# Patient Record
Sex: Female | Born: 1985 | Race: White | Hispanic: No | Marital: Single | Smoking: Former smoker
Health system: Southern US, Community
[De-identification: ages and names within clinical notes are randomized; demographics above are authoritative.]

## PROBLEM LIST (undated history)

## (undated) DIAGNOSIS — R87629 Unspecified abnormal cytological findings in specimens from vagina: Secondary | ICD-10-CM

## (undated) DIAGNOSIS — F1911 Other psychoactive substance abuse, in remission: Secondary | ICD-10-CM

## (undated) DIAGNOSIS — F32A Depression, unspecified: Secondary | ICD-10-CM

## (undated) DIAGNOSIS — F419 Anxiety disorder, unspecified: Secondary | ICD-10-CM

## (undated) HISTORY — DX: Anxiety disorder, unspecified: F41.9

## (undated) HISTORY — DX: Depression, unspecified: F32.A

---

## 2012-07-26 ENCOUNTER — Emergency Department: Payer: Self-pay | Admitting: Emergency Medicine

## 2012-07-26 LAB — CBC
Platelet: 289 10*3/uL (ref 150–440)
RBC: 4.31 10*6/uL (ref 3.80–5.20)
WBC: 14.7 10*3/uL — ABNORMAL HIGH (ref 3.6–11.0)

## 2012-07-26 LAB — URINALYSIS, COMPLETE
Bacteria: NONE SEEN
Specific Gravity: 1.034 (ref 1.003–1.030)
Squamous Epithelial: NONE SEEN

## 2012-07-26 LAB — HCG, QUANTITATIVE, PREGNANCY: Beta Hcg, Quant.: 7245 m[IU]/mL — ABNORMAL HIGH

## 2012-07-30 LAB — PATHOLOGY REPORT

## 2014-07-06 LAB — OB RESULTS CONSOLE HGB/HCT, BLOOD: Hemoglobin: 14.8 g/dL

## 2014-07-06 LAB — OB RESULTS CONSOLE HIV ANTIBODY (ROUTINE TESTING): HIV: NONREACTIVE

## 2014-07-07 ENCOUNTER — Ambulatory Visit: Payer: Self-pay | Admitting: Family Medicine

## 2014-07-07 LAB — OB RESULTS CONSOLE VARICELLA ZOSTER ANTIBODY, IGG: Varicella: IMMUNE

## 2014-07-07 LAB — OB RESULTS CONSOLE ABO/RH: RH Type: POSITIVE

## 2014-07-07 LAB — OB RESULTS CONSOLE HEPATITIS B SURFACE ANTIGEN: HEP B S AG: NEGATIVE

## 2014-07-07 LAB — OB RESULTS CONSOLE HGB/HCT, BLOOD: HCT: 42 %

## 2014-07-07 LAB — OB RESULTS CONSOLE TSH: TSH: 0.17

## 2014-07-07 LAB — OB RESULTS CONSOLE RPR: RPR: NONREACTIVE

## 2014-07-07 LAB — OB RESULTS CONSOLE PLATELET COUNT: Platelets: 272 10*3/uL

## 2014-07-07 LAB — OB RESULTS CONSOLE RUBELLA ANTIBODY, IGM: Rubella: NON-IMMUNE/NOT IMMUNE

## 2014-07-08 LAB — OB RESULTS CONSOLE GC/CHLAMYDIA
CHLAMYDIA, DNA PROBE: NEGATIVE
GC PROBE AMP, GENITAL: NEGATIVE

## 2014-07-30 ENCOUNTER — Encounter: Payer: Self-pay | Admitting: Maternal & Fetal Medicine

## 2014-08-21 NOTE — L&D Delivery Note (Signed)
VAGINAL DELIVERY NOTE:  Date of Delivery: 01/23/2015 Primary OB: [facility]  Gestational Age/EDD: 5953w1d 02/05/2015, by Last Menstrual Period Antepartum complications: none Attending Physician: Annamarie MajorPaul Hilda Rynders, MD, FACOG Delivery Type: spontaneous vaginal delivery  Anesthesia: none Laceration: periurethral Episiotomy: none Placenta: spontaneous Intrapartum complications: None Estimated Blood Loss: 250 GBS: neg Procedure Details: ROM at time of crowning.  Left periurethral repaired.  Baby: Doreatha MartinLiveborn female, Apgars 5/7

## 2014-09-10 ENCOUNTER — Encounter: Payer: Self-pay | Admitting: Obstetrics & Gynecology

## 2014-10-01 ENCOUNTER — Encounter: Payer: Self-pay | Admitting: Maternal & Fetal Medicine

## 2014-11-19 ENCOUNTER — Encounter
Admit: 2014-11-19 | Disposition: A | Discharge status: Home / Self Care | Payer: Self-pay | Attending: Obstetrics and Gynecology | Admitting: Obstetrics and Gynecology

## 2014-11-20 ENCOUNTER — Encounter
Admit: 2014-11-20 | Disposition: A | Discharge status: Home / Self Care | Payer: Self-pay | Attending: Obstetrics and Gynecology | Admitting: Obstetrics and Gynecology

## 2014-12-09 ENCOUNTER — Ambulatory Visit
Admit: 2014-12-09 | Disposition: A | Discharge status: Home / Self Care | Payer: Self-pay | Attending: Obstetrics and Gynecology | Admitting: Obstetrics and Gynecology

## 2015-01-07 ENCOUNTER — Other Ambulatory Visit: Payer: Self-pay | Admitting: Family Medicine

## 2015-01-08 LAB — OB RESULTS CONSOLE GBS: GBS: NEGATIVE

## 2015-01-23 ENCOUNTER — Inpatient Hospital Stay
Admission: EM | Admit: 2015-01-23 | Discharge: 2015-01-25 | DRG: 775 | Disposition: A | Discharge status: Home / Self Care | Payer: Medicaid Other | Attending: Obstetrics & Gynecology | Admitting: Obstetrics & Gynecology

## 2015-01-23 DIAGNOSIS — O09893 Supervision of other high risk pregnancies, third trimester: Secondary | ICD-10-CM | POA: Diagnosis present

## 2015-01-23 DIAGNOSIS — Z3A38 38 weeks gestation of pregnancy: Secondary | ICD-10-CM | POA: Diagnosis present

## 2015-01-23 DIAGNOSIS — O99333 Smoking (tobacco) complicating pregnancy, third trimester: Secondary | ICD-10-CM | POA: Diagnosis present

## 2015-01-23 DIAGNOSIS — Z3490 Encounter for supervision of normal pregnancy, unspecified, unspecified trimester: Secondary | ICD-10-CM

## 2015-01-23 DIAGNOSIS — N879 Dysplasia of cervix uteri, unspecified: Secondary | ICD-10-CM | POA: Diagnosis present

## 2015-01-23 DIAGNOSIS — F1120 Opioid dependence, uncomplicated: Secondary | ICD-10-CM | POA: Diagnosis present

## 2015-01-23 DIAGNOSIS — O99324 Drug use complicating childbirth: Secondary | ICD-10-CM | POA: Diagnosis present

## 2015-01-23 HISTORY — DX: Other psychoactive substance abuse, in remission: F19.11

## 2015-01-23 HISTORY — DX: Unspecified abnormal cytological findings in specimens from vagina: R87.629

## 2015-01-23 LAB — URINE DRUG SCREEN, QUALITATIVE (ARMC ONLY)
Amphetamines, Ur Screen: NOT DETECTED
BENZODIAZEPINE, UR SCRN: NOT DETECTED
Barbiturates, Ur Screen: NOT DETECTED
CANNABINOID 50 NG, UR ~~LOC~~: NOT DETECTED
Cocaine Metabolite,Ur ~~LOC~~: NOT DETECTED
MDMA (Ecstasy)Ur Screen: NOT DETECTED
METHADONE SCREEN, URINE: NOT DETECTED
Opiate, Ur Screen: NOT DETECTED
Phencyclidine (PCP) Ur S: NOT DETECTED
Tricyclic, Ur Screen: NOT DETECTED

## 2015-01-23 LAB — CBC
HCT: 38.4 % (ref 35.0–47.0)
Hemoglobin: 13.4 g/dL (ref 12.0–16.0)
MCH: 31.2 pg (ref 26.0–34.0)
MCHC: 34.9 g/dL (ref 32.0–36.0)
MCV: 89.4 fL (ref 80.0–100.0)
PLATELETS: 214 10*3/uL (ref 150–440)
RBC: 4.3 MIL/uL (ref 3.80–5.20)
RDW: 12.9 % (ref 11.5–14.5)
WBC: 18.9 10*3/uL — ABNORMAL HIGH (ref 3.6–11.0)

## 2015-01-23 LAB — ABO/RH: ABO/RH(D): A POS

## 2015-01-23 LAB — TYPE AND SCREEN
ABO/RH(D): A POS
Antibody Screen: NEGATIVE

## 2015-01-23 MED ORDER — SENNOSIDES-DOCUSATE SODIUM 8.6-50 MG PO TABS
2.0000 | ORAL_TABLET | ORAL | Status: DC
  Administered 2015-01-24 (×2): 2 via ORAL
  Filled 2015-01-23 (×3): qty 2

## 2015-01-23 MED ORDER — BUPRENORPHINE HCL 8 MG SL SUBL
12.0000 mg | SUBLINGUAL_TABLET | Freq: Every day | SUBLINGUAL | Status: DC
  Administered 2015-01-23 – 2015-01-25 (×3): 12 mg via SUBLINGUAL
  Filled 2015-01-23 (×2): qty 2
  Filled 2015-01-23: qty 6

## 2015-01-23 MED ORDER — BUTORPHANOL TARTRATE 1 MG/ML IJ SOLN
1.0000 mg | INTRAMUSCULAR | Status: DC | PRN
  Administered 2015-01-23: 2 mg via INTRAVENOUS

## 2015-01-23 MED ORDER — SIMETHICONE 80 MG PO CHEW
80.0000 mg | CHEWABLE_TABLET | ORAL | Status: DC | PRN
  Filled 2015-01-23: qty 1

## 2015-01-23 MED ORDER — BENZOCAINE-MENTHOL 20-0.5 % EX AERO
1.0000 "application " | INHALATION_SPRAY | CUTANEOUS | Status: DC | PRN
  Administered 2015-01-23: 1 via TOPICAL
  Filled 2015-01-23: qty 56

## 2015-01-23 MED ORDER — SODIUM CHLORIDE 0.9 % IV SOLN: 250.0000 mL | INTRAVENOUS | Status: DC | PRN

## 2015-01-23 MED ORDER — LACTATED RINGERS IV SOLN
INTRAVENOUS | Status: DC
  Administered 2015-01-23: 125 mL/h via INTRAVENOUS

## 2015-01-23 MED ORDER — IBUPROFEN 600 MG PO TABS
ORAL_TABLET | ORAL | Status: AC
  Administered 2015-01-23: 600 mg via ORAL
  Filled 2015-01-23: qty 1

## 2015-01-23 MED ORDER — ONDANSETRON HCL 4 MG/2ML IJ SOLN: 4.0000 mg | INTRAMUSCULAR | Status: DC | PRN

## 2015-01-23 MED ORDER — ACETAMINOPHEN 325 MG PO TABS: 650.0000 mg | ORAL_TABLET | ORAL | Status: DC | PRN

## 2015-01-23 MED ORDER — OXYTOCIN 40 UNITS IN LACTATED RINGERS INFUSION - SIMPLE MED
62.5000 mL/h | INTRAVENOUS | Status: DC | PRN
  Administered 2015-01-23: 62.5 mL/h via INTRAVENOUS

## 2015-01-23 MED ORDER — LIDOCAINE HCL (PF) 1 % IJ SOLN
INTRAMUSCULAR | Status: AC
  Administered 2015-01-23: 06:00:00
  Filled 2015-01-23: qty 30

## 2015-01-23 MED ORDER — PRENATAL MULTIVITAMIN CH
1.0000 | ORAL_TABLET | Freq: Every day | ORAL | Status: DC
  Administered 2015-01-23 – 2015-01-25 (×3): 1 via ORAL
  Filled 2015-01-23 (×3): qty 1

## 2015-01-23 MED ORDER — SODIUM CHLORIDE 0.9 % IJ SOLN: 3.0000 mL | Freq: Two times a day (BID) | INTRAMUSCULAR | Status: DC

## 2015-01-23 MED ORDER — ONDANSETRON HCL 4 MG PO TABS
4.0000 mg | ORAL_TABLET | ORAL | Status: DC | PRN
  Filled 2015-01-23: qty 1

## 2015-01-23 MED ORDER — SODIUM CHLORIDE 0.9 % IJ SOLN: 3.0000 mL | INTRAMUSCULAR | Status: DC | PRN

## 2015-01-23 MED ORDER — IBUPROFEN 600 MG PO TABS
600.0000 mg | ORAL_TABLET | Freq: Four times a day (QID) | ORAL | Status: DC
  Administered 2015-01-23 – 2015-01-25 (×10): 600 mg via ORAL
  Filled 2015-01-23 (×9): qty 1

## 2015-01-23 MED ORDER — OXYTOCIN 40 UNITS IN LACTATED RINGERS INFUSION - SIMPLE MED
INTRAVENOUS | Status: AC
  Filled 2015-01-23: qty 1000

## 2015-01-23 MED ORDER — WITCH HAZEL-GLYCERIN EX PADS: 1.0000 "application " | MEDICATED_PAD | CUTANEOUS | Status: DC | PRN

## 2015-01-23 MED ORDER — FERROUS SULFATE 325 (65 FE) MG PO TABS
325.0000 mg | ORAL_TABLET | Freq: Two times a day (BID) | ORAL | Status: DC
  Administered 2015-01-23 – 2015-01-25 (×5): 325 mg via ORAL
  Filled 2015-01-23 (×5): qty 1

## 2015-01-23 MED ORDER — DIBUCAINE 1 % RE OINT: 1.0000 "application " | TOPICAL_OINTMENT | RECTAL | Status: DC | PRN

## 2015-01-23 MED ORDER — CALCIUM CARBONATE ANTACID 500 MG PO CHEW: 2.0000 | CHEWABLE_TABLET | ORAL | Status: DC | PRN

## 2015-01-23 MED ORDER — DIPHENHYDRAMINE HCL 25 MG PO CAPS: 25.0000 mg | ORAL_CAPSULE | Freq: Four times a day (QID) | ORAL | Status: DC | PRN

## 2015-01-23 MED ORDER — MEASLES, MUMPS & RUBELLA VAC ~~LOC~~ INJ
0.5000 mL | INJECTION | Freq: Once | SUBCUTANEOUS | Status: AC
Start: 2015-01-24 — End: 2015-01-25
  Administered 2015-01-25: 0.5 mL via SUBCUTANEOUS
  Filled 2015-01-23: qty 0.5

## 2015-01-23 MED ORDER — LANOLIN HYDROUS EX OINT: TOPICAL_OINTMENT | CUTANEOUS | Status: DC | PRN

## 2015-01-23 MED ORDER — ZOLPIDEM TARTRATE 5 MG PO TABS: 5.0000 mg | ORAL_TABLET | Freq: Every evening | ORAL | Status: DC | PRN

## 2015-01-23 NOTE — H&P (Signed)
Obstetrics Admission History & Physical   No chief complaint on file.   HPI:  29 y.o. G2P0010 @ 1657w1d (02/05/2015, by Last Menstrual Period). Admitted on 01/23/2015:   Ctxs tonight.  No VB or ROM.  PNC at [facility] and also [facility], particularly for Cervical Dysplasia. 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  Biopsies.Rubella NI.  Patient Active Problem List   Diagnosis Date Noted  . Pregnancy 01/23/2015    Presents for LABOR.  [facility] at [facility]  PMHx:  Past Medical History  Diagnosis Date  . Vaginal Pap smear, abnormal    PSHx: No past surgical history on file. Medications:  No prescriptions prior to admission   Allergies: has No Known Allergies. OBHx:  OB History  Gravida Para Term Preterm AB SAB TAB Ectopic Multiple Living  2 0 0 0 1 1 0 0 0 0     # Outcome Date GA Lbr Len/2nd Weight Sex Delivery Anes PTL Lv  2 Current           1 SAB              ZOX:[id number]:Negative/unremarkable except as detailed in HPI. Soc Hx: Current smoker and Recreational drug use: former  Objective:   Filed Vitals:   01/23/15 0349  BP: 117/70  Pulse: 82  Temp: 98.1 F (36.7 C)  Resp: 20   General: Well nourished, well developed female in no acute distress.  Skin: Warm and dry.  Cardiovascular:Regular rate and rhythm. Respiratory: Clear to auscultation bilateral. Normal respiratory effort Abdomen: moderate Neuro/Psych: Normal mood and affect.   Pelvic exam: is not limited by body habitus EGBUS: within normal limits Vagina: within normal limits and with normal mucosa blood in the vault Cervix: 8/90/0 on admission Uterus: Spontaneous uterine activity  Adnexa: normal adnexa  EFM:FHR: 140 bpm, variability: moderate,  accelerations:  Present,  decelerations:  Absent Toco: Frequency: Every 2 minutes   Perinatal info:   A POS/A POS/ Rubella- Not immune/Varicella -Immune/ TDaP last tetanus booster within 10 years / RPR nr / HIV neg/ HBsAg neg    Assessment &  Plan:   29 y.o. G2P0010 @ 6957w1d, Admitted on 01/23/2015: LABOR, anticipate delivery     1. FWB: FHT category 1.  2. Pain management: Analgesia and epidural discussed. 3. ID: GBSnegative. Treat with ABX if positive. 4. Labor management: Exp Mgt

## 2015-01-24 LAB — CBC
HEMATOCRIT: 35.4 % (ref 35.0–47.0)
Hemoglobin: 11.7 g/dL — ABNORMAL LOW (ref 12.0–16.0)
MCH: 29.9 pg (ref 26.0–34.0)
MCHC: 33 g/dL (ref 32.0–36.0)
MCV: 90.6 fL (ref 80.0–100.0)
PLATELETS: 221 10*3/uL (ref 150–440)
RBC: 3.9 MIL/uL (ref 3.80–5.20)
RDW: 13 % (ref 11.5–14.5)
WBC: 14.7 10*3/uL — ABNORMAL HIGH (ref 3.6–11.0)

## 2015-01-24 NOTE — Progress Notes (Addendum)
Post Partum Day 1 Subjective:  Doing well, ambulating, tolerating regular PO diet, voiding spontaneously, tolerating pain with PO meds.  NO CP, SOB, Fever/chills, nausea/vomiting, or calf pain.   Objective: Blood pressure 127/80, pulse 62, temperature 98.3 F (36.8 C), temperature source Oral, resp. rate 18, SpO2 99 %, breastfeeding.  Physical Exam:  General: alert, cooperative and no distress  CV: RRR Pulm: CTABL nl resp effort Lochia: appropriate Uterine Fundus: firm DVT Evaluation: No evidence of DVT seen on physical exam.   Recent Labs  01/23/15 0324 01/24/15 0610  HGB 13.4 11.7*  HCT 38.4 35.4  WBC 18.9* 14.7*  PLT 214 221    Assessment/Plan: Plan for discharge tomorrow  Baby doing toxicology testing, unsure of disposition/discharge status Continue subutex   LOS: 1 day   Ward, Chelsea C 01/24/2015, 9:03 AM

## 2015-01-25 MED ORDER — IBUPROFEN 600 MG PO TABS: 600.0000 mg | ORAL_TABLET | Freq: Four times a day (QID) | ORAL | Status: DC

## 2015-01-25 MED ORDER — OXYTOCIN BOLUS FROM INFUSION: 500.0000 mL | INTRAVENOUS | Status: DC

## 2015-01-25 MED ORDER — PRENATAL MULTIVITAMIN CH: 1.0000 | ORAL_TABLET | Freq: Every day | ORAL | Status: DC

## 2015-01-25 MED ORDER — OXYTOCIN 40 UNITS IN LACTATED RINGERS INFUSION - SIMPLE MED: 62.5000 mL/h | INTRAVENOUS | Status: DC

## 2015-01-25 NOTE — Discharge Summary (Signed)
  Obstetrical Discharge Summary  Date of Admission: 01/23/2015 Date of Discharge: 01/25/15 Primary OB:  [facility]   Gestational Age at Delivery: 3163w1d  Antepartum complications: none Date of Delivery: 01/23/15  Delivered By: Tiburcio PeaHARRIS Delivery Type: spontaneous vaginal delivery Intrapartum complications/course: None Anesthesia: none Placenta: spontaneous Laceration: periurethral Episiotomy: none Baby: Liveborn female, Apgars 5/7, weight 5# 4oz  Post partum course: Since the delivery, patient has tolerate activity, diet, and daily functions without difficulty or complication.  Min lochia.  No breast concerns at this time.  No signs of depression currently.   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     prenatal multivitamin Tabs tablet  Take 1 tablet by mouth daily at 12 noon.     HEMOGLOBIN  Date Value Ref Range Status  01/24/2015 11.7* 12.0 - 16.0 g/dL Final  09/81/191411/16/2015 78.214.8 g/dL Final   HGB  Date Value Ref Range Status  07/26/2012 13.2 12.0-16.0 g/dL Final   HCT  Date Value Ref Range Status  01/24/2015 35.4 35.0 - 47.0 % Final  07/07/2014 42 % Final  07/26/2012 37.3 35.0-47.0 % Final    Physical Exam:  General: alert Lochia: appropriate Uterine Fundus: firm Incision: N/A DVT Evaluation: No evidence of DVT seen  on physical exam. Abdomen: abdomen is soft without significant tenderness, masses, organomegaly or guarding   Call office if you have any of the following: headache, visual changes, fever >100 F, chills, breast concerns, excessive vaginal bleeding, incision drainage or problems, leg pain or redness, depression or any other concerns.   Activity: Do not lift > 10 lbs for 6 weeks.  No intercourse or tampons for 6 weeks.  No driving for 1-2 weeks.   Discharge to: home Follow-up Information    Follow up with [facility]. Call in 6 weeks.   Why:  for postpartum check and nexplanon placement   Contact information:   [address] [place] [phone]        Marta AntuBrothers, Dondi Burandt, [place] 01/25/2015, 12:05 PM

## 2015-01-25 NOTE — Discharge Instructions (Signed)
Discharge instructions:   Call office if you have any of the following: headache, visual changes, fever >100 F, chills, breast concerns, excessive vaginal bleeding(saturating more than one pad an hour or large palm sized clots) , leg pain or redness, depression(more than just baby blues wanting to hurt yourself or the baby ) or any other concerns.   Activity: Do not lift > 10 lbs for 6 weeks.  No intercourse or tampons for 6 weeks.  No driving for 1-2 weeks.

## 2015-01-25 NOTE — Progress Notes (Signed)
All discharge instructions given to patient and she voices understanding of all instructions given.all prescriptions given.  She will make her own 6 wks f/u appt. MMR given. Pt discharged but baby will remain a patient in pediatrics with mom staying with baby.

## 2015-01-26 LAB — RPR: RPR Ser Ql: NONREACTIVE

## 2015-01-26 LAB — SURGICAL PATHOLOGY

## 2015-01-27 ENCOUNTER — Ambulatory Visit: Payer: Self-pay

## 2015-01-27 NOTE — Lactation Note (Signed)
This note was copied from the chart of Wanda Philippa Sicksshley Samek. Mom is sefLactation Consultation Note  Patient Name: Wanda Dunlap ZOXWR'UToday's Date: 01/27/2015  Mom is severely engorged today, so that latch was very difficult for baby. Mom said she has been this way for two days. I taught her how to hand express to soften areola. She immediately got 10 ml in a few minutes. We then latched baby on, and he started to swallow vigorously and often. Breast softened a little bit , but Mom still needs to express out enough milk afterwards to soften breast. Double electric pump is in room too, but she did real well with hand expression.  Since she is going home today and needs pump for home use, [facility]. Mom is to secure a ride over there before 3pm today. I also gave her some cold cabbage leaves to apply to engorged breasts after feeding/pumping. She was given a bag of snappies and labels.    Maternal Data Has patient been taught Hand Expression?: Yes  Feeding Feeding Type: Breast Fed Length of feed: 5 min  LATCH Score/Interventions Latch: Repeated attempts needed to sustain latch, nipple held in mouth throughout feeding, stimulation needed to elicit sucking reflex. Intervention(s): Assist with latch;Breast massage;Breast compression  Audible Swallowing: Spontaneous and intermittent  Type of Nipple: Everted at rest and after stimulation  Comfort (Breast/Nipple): Engorged, cracked, bleeding, large blisters, severe discomfort Problem noted: Engorgment Intervention(s): Hand expression;Cabbage leaves     Hold (Positioning): No assistance needed to correctly position infant at breast. Intervention(s): Support Pillows  LATCH Score: 7  Lactation Tools Discussed/Used     Consult Status      Sunday CornSandra Clark Shell Bl[place]te 01/27/2015, 2:02 PM

## 2015-03-25 ENCOUNTER — Inpatient Hospital Stay
Admission: RE | Admit: 2015-03-25 | Discharge: 2015-03-25 | Disposition: A | Discharge status: Home / Self Care | Payer: Medicaid Other | Source: Ambulatory Visit

## 2015-03-25 DIAGNOSIS — Z79899 Other long term (current) drug therapy: Secondary | ICD-10-CM | POA: Diagnosis not present

## 2015-03-25 DIAGNOSIS — N870 Mild cervical dysplasia: Secondary | ICD-10-CM | POA: Diagnosis not present

## 2015-03-25 DIAGNOSIS — F17210 Nicotine dependence, cigarettes, uncomplicated: Secondary | ICD-10-CM | POA: Diagnosis not present

## 2015-03-25 DIAGNOSIS — N879 Dysplasia of cervix uteri, unspecified: Secondary | ICD-10-CM | POA: Diagnosis present

## 2015-03-25 NOTE — Patient Instructions (Signed)
  Your procedure is scheduled on: 03/30/15 Tues Report to Day Surgery. To find out your arrival time please call [phone] between 1PM - 3PM on 03/29/15 Mon.  Remember: Instructions that are not followed completely may result in serious medical risk, up to and including death, or upon the discretion of your surgeon and anesthesiologist your surgery may need to be rescheduled.    __x__ 1. Do not eat food or drink liquids after midnight. No gum chewing or hard candies.     __x__ 2. No Alcohol for 24 hours before or after surgery.   ____ 3. Bring all medications with you on the day of surgery if instructed.    ___x_ 4. Notify your doctor if there is any change in your medical condition     (cold, fever, infections).     Do not wear jewelry, make-up, hairpins, clips or nail polish.  Do not wear lotions, powders, or perfumes. You may wear deodorant.  Do not shave 48 hours prior to surgery. Men may shave face and neck.  Do not bring valuables to the hospital.    [facility] is not responsible for any belongings or valuables.               Contacts, dentures or bridgework may not be worn into surgery.  Leave your suitcase in the car. After surgery it may be brought to your room.  For patients admitted to the hospital, discharge time is determined by your                treatment team.   Patients discharged the day of surgery will not be allowed to drive home.   Please read over the following fact sheets that you were given:      ____ Take these medicines the morning of surgery with A SIP OF WATER:    1.   2.   3.   4.  5.  6.  ____ Fleet Enema (as directed)   ____ Use CHG Soap as directed  ____ Use inhalers on the day of surgery  ____ Stop metformin 2 days prior to surgery    ____ Take 1/2 of usual insulin dose the night before surgery and none on the morning of surgery.   ____ Stop Coumadin/Plavix/aspirin on   __x__ Stop Anti-inflammatories on   ____ Stop supplements  until after surgery.    ____ Bring C-Pap to the hospital.

## 2015-03-30 ENCOUNTER — Encounter: Payer: Self-pay | Admitting: Anesthesiology

## 2015-03-30 ENCOUNTER — Ambulatory Visit: Payer: Medicaid Other | Admitting: Anesthesiology

## 2015-03-30 ENCOUNTER — Encounter
Admission: RE | Disposition: A | Discharge status: Home / Self Care | Payer: Self-pay | Source: Ambulatory Visit | Attending: Obstetrics & Gynecology

## 2015-03-30 ENCOUNTER — Ambulatory Visit
Admission: RE | Admit: 2015-03-30 | Discharge: 2015-03-30 | Disposition: A | Discharge status: Home / Self Care | Payer: Medicaid Other | Source: Ambulatory Visit | Attending: Obstetrics & Gynecology | Admitting: Obstetrics & Gynecology

## 2015-03-30 DIAGNOSIS — F17210 Nicotine dependence, cigarettes, uncomplicated: Secondary | ICD-10-CM | POA: Insufficient documentation

## 2015-03-30 DIAGNOSIS — N870 Mild cervical dysplasia: Secondary | ICD-10-CM | POA: Diagnosis not present

## 2015-03-30 DIAGNOSIS — Z79899 Other long term (current) drug therapy: Secondary | ICD-10-CM | POA: Insufficient documentation

## 2015-03-30 HISTORY — PX: LEEP: SHX91

## 2015-03-30 LAB — POCT PREGNANCY, URINE: Preg Test, Ur: NEGATIVE

## 2015-03-30 SURGERY — LEEP (LOOP ELECTROSURGICAL EXCISION PROCEDURE)
Anesthesia: General | Wound class: Clean Contaminated

## 2015-03-30 MED ORDER — FERRIC SUBSULFATE 259 MG/GM EX SOLN
CUTANEOUS | Status: AC
Start: 2015-03-30 — End: 2015-03-30
  Filled 2015-03-30: qty 8

## 2015-03-30 MED ORDER — LIDOCAINE-EPINEPHRINE 1 %-1:100000 IJ SOLN
INTRAMUSCULAR | Status: DC | PRN
  Administered 2015-03-30: 30 mL

## 2015-03-30 MED ORDER — LIDOCAINE-EPINEPHRINE 1 %-1:100000 IJ SOLN
INTRAMUSCULAR | Status: AC
  Filled 2015-03-30: qty 1

## 2015-03-30 MED ORDER — FAMOTIDINE 20 MG PO TABS
ORAL_TABLET | ORAL | Status: AC
  Administered 2015-03-30: 20 mg via ORAL
  Filled 2015-03-30: qty 1

## 2015-03-30 MED ORDER — FENTANYL CITRATE (PF) 100 MCG/2ML IJ SOLN: 25.0000 ug | INTRAMUSCULAR | Status: DC | PRN

## 2015-03-30 MED ORDER — ONDANSETRON HCL 4 MG/2ML IJ SOLN
INTRAMUSCULAR | Status: DC | PRN
  Administered 2015-03-30: 4 mg via INTRAVENOUS

## 2015-03-30 MED ORDER — FENTANYL CITRATE (PF) 100 MCG/2ML IJ SOLN
INTRAMUSCULAR | Status: DC | PRN
  Administered 2015-03-30 (×2): 50 ug via INTRAVENOUS

## 2015-03-30 MED ORDER — PROPOFOL 10 MG/ML IV BOLUS
INTRAVENOUS | Status: DC | PRN
  Administered 2015-03-30: 120 mg via INTRAVENOUS

## 2015-03-30 MED ORDER — LIDOCAINE HCL (CARDIAC) 20 MG/ML IV SOLN
INTRAVENOUS | Status: DC | PRN
  Administered 2015-03-30: 100 mg via INTRAVENOUS

## 2015-03-30 MED ORDER — GLYCOPYRROLATE 0.2 MG/ML IJ SOLN
INTRAMUSCULAR | Status: DC | PRN
  Administered 2015-03-30: 0.2 mg via INTRAVENOUS

## 2015-03-30 MED ORDER — ONDANSETRON HCL 4 MG/2ML IJ SOLN: 4.0000 mg | Freq: Once | INTRAMUSCULAR | Status: DC | PRN

## 2015-03-30 MED ORDER — MIDAZOLAM HCL 2 MG/2ML IJ SOLN
INTRAMUSCULAR | Status: DC | PRN
  Administered 2015-03-30: 2 mg via INTRAVENOUS

## 2015-03-30 MED ORDER — IODINE STRONG (LUGOLS) 5 % PO SOLN
ORAL | Status: DC | PRN
  Administered 2015-03-30: 0.1 mL

## 2015-03-30 MED ORDER — LACTATED RINGERS IV SOLN
INTRAVENOUS | Status: DC
  Administered 2015-03-30: 15:00:00 via INTRAVENOUS

## 2015-03-30 MED ORDER — OXYCODONE-ACETAMINOPHEN 5-325 MG PO TABS: 1.0000 | ORAL_TABLET | ORAL | Status: DC | PRN

## 2015-03-30 MED ORDER — IODINE STRONG (LUGOLS) 5 % PO SOLN
ORAL | Status: AC
Start: 2015-03-30 — End: 2015-03-30
  Filled 2015-03-30: qty 1

## 2015-03-30 MED ORDER — KETOROLAC TROMETHAMINE 30 MG/ML IJ SOLN
INTRAMUSCULAR | Status: DC | PRN
  Administered 2015-03-30: 30 mg via INTRAVENOUS

## 2015-03-30 MED ORDER — FAMOTIDINE 20 MG PO TABS
20.0000 mg | ORAL_TABLET | Freq: Once | ORAL | Status: AC
  Administered 2015-03-30: 20 mg via ORAL

## 2015-03-30 MED ORDER — KETAMINE HCL 50 MG/ML IJ SOLN
INTRAMUSCULAR | Status: DC | PRN
  Administered 2015-03-30: 20 mg via INTRAMUSCULAR

## 2015-03-30 SURGICAL SUPPLY — 31 items
APPLICATOR COTTON TIP 6IN STRL (MISCELLANEOUS) ×3 IMPLANT
BAG COUNTER SPONGE EZ (MISCELLANEOUS) ×2 IMPLANT
CANISTER SUCT 1200ML W/VALVE (MISCELLANEOUS) ×3 IMPLANT
CATH ROBINSON RED A/P 16FR (CATHETERS) ×3 IMPLANT
CNTNR SPEC 2.5X3XGRAD LEK (MISCELLANEOUS)
CONT SPEC 4OZ STER OR WHT (MISCELLANEOUS)
CONTAINER SPEC 2.5X3XGRAD LEK (MISCELLANEOUS) IMPLANT
COUNTER SPONGE BAG EZ (MISCELLANEOUS) ×1
ELECT LEEP BALL 5MM 12CM (MISCELLANEOUS) ×3
ELECT LEEP LOOP 20X10 R2010 (MISCELLANEOUS) ×3
ELECT LOOP 1.0X1.0CM R1010 (MISCELLANEOUS) ×3
ELECTRODE LEEP BALL 5MM 12CM (MISCELLANEOUS) ×1 IMPLANT
ELECTRODE LEP LOOP 20X10 R2010 (MISCELLANEOUS) ×1 IMPLANT
ELECTRODE LOOP 1.0X1.0CM R1010 (MISCELLANEOUS) ×1 IMPLANT
GLOVE BIO SURGEON STRL SZ8 (GLOVE) ×9 IMPLANT
GOWN STRL REUS W/ TWL LRG LVL3 (GOWN DISPOSABLE) ×2 IMPLANT
GOWN STRL REUS W/ TWL XL LVL3 (GOWN DISPOSABLE) ×1 IMPLANT
GOWN STRL REUS W/TWL LRG LVL3 (GOWN DISPOSABLE) ×4
GOWN STRL REUS W/TWL XL LVL3 (GOWN DISPOSABLE) ×2
NEEDLE SPNL 22GX3.5 QUINCKE BK (NEEDLE) ×3 IMPLANT
NS IRRIG 500ML POUR BTL (IV SOLUTION) ×3 IMPLANT
PACK DNC HYST (MISCELLANEOUS) ×3 IMPLANT
PAD GROUND ADULT SPLIT (MISCELLANEOUS) ×3 IMPLANT
PAD OB MATERNITY 4.3X12.25 (PERSONAL CARE ITEMS) ×3 IMPLANT
PAD PREP 24X41 OB/GYN DISP (PERSONAL CARE ITEMS) ×3 IMPLANT
PENCIL ELECTRO HAND CTR (MISCELLANEOUS) ×3 IMPLANT
STRAP SAFETY BODY (MISCELLANEOUS) ×3 IMPLANT
STRAW SMOKE EVAC LEEP 6150 NON (MISCELLANEOUS) ×3 IMPLANT
SYR CONTROL 10ML (SYRINGE) ×3 IMPLANT
TUBING CONNECTING 10 (TUBING) ×2 IMPLANT
TUBING CONNECTING 10' (TUBING) ×1

## 2015-03-30 NOTE — Anesthesia Preprocedure Evaluation (Addendum)
Anesthesia Evaluation  Patient identified by MRN, date of birth, ID band Patient awake    Reviewed: Allergy & Precautions, NPO status , Patient's Chart, lab work & pertinent test results  Airway Mallampati: II  TM Distance: >3 FB Neck ROM: Full    Dental  (+) Chipped, Caps Temporary cap L upper molar:   Pulmonary Current Smoker,  breath sounds clear to auscultation  Pulmonary exam normal       Cardiovascular negative cardio ROS Normal cardiovascular exam    Neuro/Psych negative neurological ROS  negative psych ROS   GI/Hepatic negative GI ROS, Neg liver ROS,   Endo/Other  negative endocrine ROS  Renal/GU negative Renal ROS   Abnormal pap smear negative genitourinary   Musculoskeletal negative musculoskeletal ROS (+)   Abdominal Normal abdominal exam  (+)   Peds negative pediatric ROS (+)  Hematology negative hematology ROS (+)   Anesthesia Other Findings   Reproductive/Obstetrics negative OB ROS                            Anesthesia Physical Anesthesia Plan  ASA: II  Anesthesia Plan: General   Post-op Pain Management:    Induction: Intravenous  Airway Management Planned: LMA  Additional Equipment:   Intra-op Plan:   Post-operative Plan: Extubation in OR  Informed Consent: I have reviewed the patients History and Physical, chart, labs and discussed the procedure including the risks, benefits and alternatives for the proposed anesthesia with the patient or authorized representative who has indicated his/her understanding and acceptance.   Dental advisory given  Plan Discussed with: CRNA and Surgeon  Anesthesia Plan Comments:         Anesthesia Quick Evaluation

## 2015-03-30 NOTE — Discharge Instructions (Signed)
Loop Electrosurgical Excision Procedure Care After Refer to this sheet in the next few weeks. These instructions provide you with information on caring for yourself after your procedure. Your caregiver may also give you more specific instructions. Your treatment has been planned according to current medical practices, but problems sometimes occur. Call your caregiver if you have any problems or questions after your procedure. HOME CARE INSTRUCTIONS   Do not use tampons, douche, or have sexual intercourse for 2 weeks or as directed by your caregiver.  Begin normal activities if you have no or minimal cramping or bleeding, unless directed otherwise by your caregiver.  Take your temperature if you feel sick. Write down your temperature on paper, and tell your caregiver if you have a fever.  Take all medicines as directed by your caregiver.  Keep all your follow-up appointments and Pap tests as directed by your caregiver. SEEK IMMEDIATE MEDICAL CARE IF:   You have bleeding that is heavier or longer than a normal menstrual cycle.  You have bleeding that is bright red.  You have blood clots.  You have a fever.  You have increasing cramps or pain not relieved by medicine.  You develop abdominal pain that does not seem to be related to the same area of earlier cramping and pain.  You are lightheaded, unusually weak, or faint.  You develop painful or bloody urination.  You develop a bad smelling vaginal discharge. MAKE SURE YOU:  Understand these instructions.  Will watch your condition.  Will get help right away if you are not doing well or get worse. Document Released: 04/20/2011 Document Revised: 10/30/2011 Document Reviewed: 04/20/2011 [facility] Patient Information 2015 [place], [place]. This information is not intended to replace advice given to you by your health care provider. Make sure you discuss any questions you have with your health care provider. AMBULATORY SURGERY    DISCHARGE INSTRUCTIONS   1) The drugs that you were given will stay in your system until tomorrow so for the next 24 hours you should not:  A) Drive an automobile B) Make any legal decisions C) Drink any alcoholic beverage   2) You may resume regular meals tomorrow.  Today it is better to start with liquids and gradually work up to solid foods.  You may eat anything you prefer, but it is better to start with liquids, then soup and crackers, and gradually work up to solid foods.   3) Please notify your doctor immediately if you have any unusual bleeding, trouble breathing, redness and pain at the surgery site, drainage, fever, or pain not relieved by medication.    4) Additional Instructions:        Please contact your physician with any problems or Same Day Surgery at [phone], Monday through Friday 6 am to 4 pm, or [place] at [facility] number at [phone].

## 2015-03-30 NOTE — H&P (Signed)
History and Physical Interval Note:  03/30/2015 1:52 PM  Wanda Dunlap  has presented today for surgery, with the diagnosis of CERVICAL DYSPLASIA  The various methods of treatment have been discussed with the patient and family. After consideration of risks, benefits and other options for treatment, the patient has consented to  Procedure(s): LOOP ELECTROSURGICAL EXCISION PROCEDURE (LEEP) (N/A) as a surgical intervention .  The patient's history has been reviewed, patient examined, no change in status, stable for surgery.  Pt has the following beta blocker history-  Not taking Beta Blocker.  I have reviewed the patient's chart and labs.  Questions were answered to the patient's satisfaction.       Decklyn Hornik Renae Fickle

## 2015-03-30 NOTE — Op Note (Signed)
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 Cervix grossly showed normal findings.    PROCEDURE IN DETAIL: After informed consent was obtained, the patient was taken to the operating room where general anesthesia was administered without difficulty. The patient was positioned in the dorsal lithotomy position in [place] stirrups. Time-out was performed. The patient was examined under anesthesia, and the above findings were noted. She was prepped and draped in normal sterile fashion. The patient's bladder was cathaterized with an in and out catheter.  A weighted speculum and deaver were placed inside the patient's vagina. The above mentioned findings were noted.  Using the LEEP device and loops for ecto- and endo- cervical specimens,  the conization was performed, making sure to encompass the entire lesion and transformation zone and to take a deeper second specimen. The cone bed was then cauterized to achieve hemostasis. Speculum was removed and patient awoken from anesthesia.  The patient tolerated the procedure well. Sponge, lap, needle, and instrument counts were correct x 2. The patient was transferred to the recovery room awake, alert and breathing independently in stable condition.

## 2015-03-30 NOTE — Transfer of Care (Signed)
Immediate Anesthesia Transfer of Care Note  Patient: Wanda Dunlap  Procedure(s) Performed: Procedure(s): LOOP ELECTROSURGICAL EXCISION PROCEDURE (LEEP) (N/A)  Patient Location: PACU  Anesthesia Type:General  Level of Consciousness: sedated  Airway & Oxygen Therapy: Patient Spontanous Breathing and Patient connected to face mask oxygen  Post-op Assessment: Report given to RN  Post vital signs: Reviewed  Last Vitals:  Filed Vitals:   03/30/15 1520  BP: 95/46  Pulse:   Temp: 36.6 C  Resp: 12    Complications: No apparent anesthesia complications

## 2015-03-30 NOTE — Anesthesia Postprocedure Evaluation (Signed)
  Anesthesia Post-op Note  Patient: Wanda Dunlap  Procedure(s) Performed: Procedure(s): LOOP ELECTROSURGICAL EXCISION PROCEDURE (LEEP) (N/A)  Anesthesia type:General  Patient location: PACU  Post pain: Pain level controlled  Post assessment: Post-op Vital signs reviewed, Patient's Cardiovascular Status Stable, Respiratory Function Stable, Patent Airway and No signs of Nausea or vomiting  Post vital signs: Reviewed and stable  Last Vitals:  Filed Vitals:   03/30/15 1655  BP: 137/78  Pulse: 72  Temp:   Resp: 16    Level of consciousness: awake, alert  and patient cooperative  Complications: No apparent anesthesia complications

## 2015-03-30 NOTE — Anesthesia Procedure Notes (Signed)
Procedure Name: LMA Insertion Date/Time: 03/30/2015 2:47 PM Performed by: Shirlee Limerick, Rael Tilly Pre-anesthesia Checklist: Patient identified, Emergency Drugs available, Suction available and Patient being monitored Patient Re-evaluated:Patient Re-evaluated prior to inductionOxygen Delivery Method: Circle system utilized Preoxygenation: Pre-oxygenation with 100% oxygen Intubation Type: IV induction LMA Size: 4.0

## 2015-03-31 ENCOUNTER — Encounter: Payer: Self-pay | Admitting: Obstetrics & Gynecology

## 2015-04-01 LAB — SURGICAL PATHOLOGY

## 2018-02-18 DIAGNOSIS — Z5181 Encounter for therapeutic drug level monitoring: Secondary | ICD-10-CM | POA: Diagnosis not present

## 2018-02-20 DIAGNOSIS — Z5181 Encounter for therapeutic drug level monitoring: Secondary | ICD-10-CM | POA: Diagnosis not present

## 2018-02-25 DIAGNOSIS — Z5181 Encounter for therapeutic drug level monitoring: Secondary | ICD-10-CM | POA: Diagnosis not present

## 2018-02-27 DIAGNOSIS — Z5181 Encounter for therapeutic drug level monitoring: Secondary | ICD-10-CM | POA: Diagnosis not present

## 2018-03-05 DIAGNOSIS — Z5181 Encounter for therapeutic drug level monitoring: Secondary | ICD-10-CM | POA: Diagnosis not present

## 2018-03-07 DIAGNOSIS — Z5181 Encounter for therapeutic drug level monitoring: Secondary | ICD-10-CM | POA: Diagnosis not present

## 2018-03-12 DIAGNOSIS — Z5181 Encounter for therapeutic drug level monitoring: Secondary | ICD-10-CM | POA: Diagnosis not present

## 2018-03-14 DIAGNOSIS — M7989 Other specified soft tissue disorders: Secondary | ICD-10-CM | POA: Diagnosis not present

## 2018-03-14 DIAGNOSIS — Z91030 Bee allergy status: Secondary | ICD-10-CM | POA: Diagnosis not present

## 2018-03-14 DIAGNOSIS — Z5181 Encounter for therapeutic drug level monitoring: Secondary | ICD-10-CM | POA: Diagnosis not present

## 2018-03-20 DIAGNOSIS — Z5181 Encounter for therapeutic drug level monitoring: Secondary | ICD-10-CM | POA: Diagnosis not present

## 2018-03-25 DIAGNOSIS — Z5181 Encounter for therapeutic drug level monitoring: Secondary | ICD-10-CM | POA: Diagnosis not present

## 2018-04-02 DIAGNOSIS — Z5181 Encounter for therapeutic drug level monitoring: Secondary | ICD-10-CM | POA: Diagnosis not present

## 2018-04-08 DIAGNOSIS — Z5181 Encounter for therapeutic drug level monitoring: Secondary | ICD-10-CM | POA: Diagnosis not present

## 2018-04-15 DIAGNOSIS — Z5181 Encounter for therapeutic drug level monitoring: Secondary | ICD-10-CM | POA: Diagnosis not present

## 2018-04-23 DIAGNOSIS — Z5181 Encounter for therapeutic drug level monitoring: Secondary | ICD-10-CM | POA: Diagnosis not present

## 2018-04-29 DIAGNOSIS — Z5181 Encounter for therapeutic drug level monitoring: Secondary | ICD-10-CM | POA: Diagnosis not present

## 2018-05-06 DIAGNOSIS — Z5181 Encounter for therapeutic drug level monitoring: Secondary | ICD-10-CM | POA: Diagnosis not present

## 2018-05-13 DIAGNOSIS — Z5181 Encounter for therapeutic drug level monitoring: Secondary | ICD-10-CM | POA: Diagnosis not present

## 2018-05-20 DIAGNOSIS — Z5181 Encounter for therapeutic drug level monitoring: Secondary | ICD-10-CM | POA: Diagnosis not present

## 2018-05-27 DIAGNOSIS — Z5181 Encounter for therapeutic drug level monitoring: Secondary | ICD-10-CM | POA: Diagnosis not present

## 2018-06-03 DIAGNOSIS — Z5181 Encounter for therapeutic drug level monitoring: Secondary | ICD-10-CM | POA: Diagnosis not present

## 2018-06-10 DIAGNOSIS — Z5181 Encounter for therapeutic drug level monitoring: Secondary | ICD-10-CM | POA: Diagnosis not present

## 2018-07-15 DIAGNOSIS — Z30017 Encounter for initial prescription of implantable subdermal contraceptive: Secondary | ICD-10-CM | POA: Diagnosis not present

## 2018-07-15 DIAGNOSIS — Z3009 Encounter for other general counseling and advice on contraception: Secondary | ICD-10-CM | POA: Diagnosis not present

## 2018-07-15 DIAGNOSIS — Z309 Encounter for contraceptive management, unspecified: Secondary | ICD-10-CM | POA: Diagnosis not present

## 2019-03-05 DIAGNOSIS — H5213 Myopia, bilateral: Secondary | ICD-10-CM | POA: Diagnosis not present

## 2019-03-06 DIAGNOSIS — H5213 Myopia, bilateral: Secondary | ICD-10-CM | POA: Diagnosis not present

## 2019-05-07 DIAGNOSIS — H52223 Regular astigmatism, bilateral: Secondary | ICD-10-CM | POA: Diagnosis not present

## 2020-01-16 DIAGNOSIS — Z23 Encounter for immunization: Secondary | ICD-10-CM | POA: Diagnosis not present

## 2020-02-13 DIAGNOSIS — Z23 Encounter for immunization: Secondary | ICD-10-CM | POA: Diagnosis not present

## 2021-04-30 DIAGNOSIS — Z20822 Contact with and (suspected) exposure to covid-19: Secondary | ICD-10-CM | POA: Diagnosis not present

## 2021-05-05 DIAGNOSIS — Z20822 Contact with and (suspected) exposure to covid-19: Secondary | ICD-10-CM | POA: Diagnosis not present

## 2021-06-02 ENCOUNTER — Other Ambulatory Visit: Payer: Self-pay

## 2021-06-02 ENCOUNTER — Ambulatory Visit: Payer: Medicaid Other

## 2021-06-02 ENCOUNTER — Encounter: Payer: Self-pay | Admitting: Family Medicine

## 2021-06-02 ENCOUNTER — Ambulatory Visit (LOCAL_COMMUNITY_HEALTH_CENTER): Payer: Medicaid Other | Admitting: Family Medicine

## 2021-06-02 VITALS — BP 120/72 | HR 76 | Temp 98.4°F | Resp 16 | Ht 60.0 in | Wt 106.0 lb

## 2021-06-02 DIAGNOSIS — Z8742 Personal history of other diseases of the female genital tract: Secondary | ICD-10-CM

## 2021-06-02 DIAGNOSIS — Z01419 Encounter for gynecological examination (general) (routine) without abnormal findings: Secondary | ICD-10-CM

## 2021-06-02 DIAGNOSIS — Z0389 Encounter for observation for other suspected diseases and conditions ruled out: Secondary | ICD-10-CM | POA: Diagnosis not present

## 2021-06-02 DIAGNOSIS — Z1272 Encounter for screening for malignant neoplasm of vagina: Secondary | ICD-10-CM | POA: Diagnosis not present

## 2021-06-02 DIAGNOSIS — Z113 Encounter for screening for infections with a predominantly sexual mode of transmission: Secondary | ICD-10-CM

## 2021-06-02 DIAGNOSIS — Z3009 Encounter for other general counseling and advice on contraception: Secondary | ICD-10-CM

## 2021-06-06 NOTE — Progress Notes (Signed)
[facility] DEPARTMENT [facility] [address] Main Number: [phone]  Family Planning Visit- Initial Visit  Subjective:  Wanda Dunlap is a 35 y.o.  G2P1011   being seen today for an initial annual visit and to discuss contraceptive options.  The patient is currently using Nexplanon for pregnancy prevention. Patient reports she does not want a pregnancy in the next year.  Patient has the following medical conditions has Pregnancy; Postpartum care following vaginal delivery; and History of abnormal cervical Pap smear on their problem list.  Chief Complaint  Patient presents with   Annual Exam    Patient reports here for physical, pap, STI screening and nexplanon removal.    Patient denies sex since 03/21/2020    Body mass index is 20.7 kg/m. - Patient is eligible for diabetes screening based on BMI and age >43?  no HA1C ordered? no  Patient reports 0  partner/s in last year. Desires STI screening?  Yes  Has patient been screened once for HCV in the past?  No  No results found for: HCVAB  Does the patient have current drug use (including MJ), have a partner with drug use, and/or has been incarcerated since last result? No  If yes-- Screen for HCV through [facility]   Does the patient meet criteria for HBV testing? Yes  Criteria:  -Household, sexual or needle sharing contact with HBV -History of drug use -HIV positive -Those with known Hep C   Health Maintenance Due  Topic Date Due   COVID-19 Vaccine (1) Never done   Hepatitis C Screening  Never done   TETANUS/TDAP  Never done   PAP SMEAR-Modifier  Never done   INFLUENZA VACCINE  Never done    Review of Systems  Gastrointestinal:  Positive for nausea.  Neurological:  Positive for headaches.   The following portions of the patient's history were reviewed and updated as appropriate: allergies, current medications, past family history, past medical history, past social  history, past surgical history and problem list. Problem list updated.   See flowsheet for other program required questions.  Objective:   Vitals:   06/02/21 1040  BP: 120/72  Pulse: 76  Resp: 16  Temp: 98.4 F (36.9 C)  TempSrc: Temporal  Weight: 106 lb (48.1 kg)  Height: 5' (1.524 m)    Physical Exam Vitals and nursing note reviewed.  Constitutional:      Appearance: Normal appearance.  HENT:     Head: Normocephalic and atraumatic.     Mouth/Throat:     Mouth: Mucous membranes are moist.     Dentition: Normal dentition. No dental caries.     Pharynx: No oropharyngeal exudate or posterior oropharyngeal erythema.  Eyes:     General: No scleral icterus. Neck:     Thyroid: No thyroid mass, thyromegaly or thyroid tenderness.  Cardiovascular:     Rate and Rhythm: Normal rate and regular rhythm.     Pulses: Normal pulses.     Heart sounds: Normal heart sounds.  Pulmonary:     Effort: Pulmonary effort is normal.     Breath sounds: Normal breath sounds.  Chest:  Breasts:    Right: Normal.     Left: Normal.     Comments: Breasts:        Right: Normal. No swelling, mass, nipple discharge, skin change or tenderness.        Left: Normal. No swelling, mass, nipple discharge, skin change or tenderness.   Abdominal:  General: Abdomen is flat. Bowel sounds are normal.     Palpations: Abdomen is soft.  Genitourinary:    Comments: External genitalia without, lice, nits, erythema, edema , lesions or inguinal adenopathy. Vagina with normal mucosa and discharge and pH equals 4.  Cervix without visual lesions, uterus firm, mobile, non-tender, no masses, CMT adnexal fullness or tenderness.   Musculoskeletal:        General: Normal range of motion.     Cervical back: Normal range of motion and neck supple.  Skin:    General: Skin is warm and dry.  Neurological:     General: No focal deficit present.     Mental Status: She is alert.      Assessment and Plan:  Wanda Dunlap is a 35 y.o. female presenting [facility] the [facility] Department for an initial annual wellness/contraceptive visit   Patient was not offered ECP based on pt request.   1. Smear, vaginal, as part of routine gynecological examination Family planning physical CBE today PAP today  Pt reports Hx of daily HA with nausea that stated 1 month ago post covid infection.  Patient wakes up with HA.  Does not take any medications to help with HA.  Recommended patient to take tylenol before be to see if helps and also notify PCP for further follow up and management.    - IGP, Aptima HPV  2. Screening examination for venereal disease Pt last sex was 03/21/2020, desires screening today.   - HIV [facility] - Syphilis Serology, [facility] - Chlamydia/Gonorrhea [facility] - WET PREP FOR TRICH, YEAST, CLUE  3. 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 Family planning education, guidance, and counseling Contraception counseling: Reviewed all forms of birth control options in the tiered based approach. available including abstinence; over the counter/barrier methods; hormonal contraceptive medication including pill, patch, ring, injection,contraceptive implant, ECP; hormonal and nonhormonal IUDs; permanent sterilization options including vasectomy and the various tubal sterilization modalities. Risks, benefits, and typical effectiveness rates were reviewed.  Questions were answered.  Written information was also given to the patient to review.  Patient desires to keep nexplanon for 1 more year . She will follow up in 1 year for removal and possible reinsertion.   She was told to call with any further questions, or with any concerns about this method of contraception.  Emphasized use of condoms 100% of the time for STI prevention.   Return in about 1 year (around 06/02/2022) for annual well woman exam.  No future  appointments.  Wendi Snipes, FNP

## 2021-06-08 LAB — IGP, APTIMA HPV
HPV Aptima: POSITIVE — AB
PAP Smear Comment: 0

## 2021-06-09 ENCOUNTER — Telehealth: Payer: Self-pay

## 2021-06-09 NOTE — Telephone Encounter (Signed)
Contacted patient regarding her MyChart, Patient's question was to get an appointment with [facility] in 1 month (which is the soonest they can see her) or go to Urgent Care.    Patient states she has no pain or issues with the hernia. I suggested to patient to get that appointment with them as she as no pain but if she does develop pain to then see Urgent Care before the month.    Will notify provider, Elveria Rising, FNP to see can send the referral to her PCP.   Floy Sabina, RN

## 2021-06-13 ENCOUNTER — Other Ambulatory Visit: Payer: Self-pay | Admitting: Family Medicine

## 2021-06-23 ENCOUNTER — Telehealth: Payer: Self-pay

## 2021-06-23 ENCOUNTER — Telehealth: Payer: Self-pay | Admitting: Obstetrics and Gynecology

## 2021-06-23 NOTE — Telephone Encounter (Signed)
Wanda Dunlap with the health department called stating that the pt had a abnormal PAP and is needing to have a Colpo done.  Pt has an appointment scheduled for 06/24/2021 (Friday).

## 2021-06-23 NOTE — Telephone Encounter (Signed)
Telephone call to [facility] this morning.  Per Epic: patient had scheduled an appointment for 06/24/2021 to establish care on her own.  On the appointment notes line it read "positive HPV".   I called [facility] to make them aware I had reached out to the patient via TC, MyChart and PAP card to discuss her PAP results and the need for a Colpo referral and that the patient had already reviewed her results via MyChart on 06/23/2021 per Epic.  The receptionist at [facility] was going to make a note for the provider to see prior to tomorrow's appointment. Hart Carwin, RN

## 2021-06-23 NOTE — Telephone Encounter (Signed)
Telephone call to patient this morning and voicemail not setup.  MyChart message sent to patient regarding her 06/02/2021 PAP results.  PAP Normal and HPV Positive.  Colpo referral needed per Wendi Snipes, NP.  If patient does not follow through, we will repeat PAP in 1 year (05-2022). PAP card also mailed to the patient home address today.  HPV brochure mailed as well. Hart Carwin, RN

## 2021-06-23 NOTE — Telephone Encounter (Signed)
Return call by patient today.  Patient agrees to have Dr. Logan Bores do the Colpo.  She will discuss with him at tomorrow's appointment 06/24/2021 9 am and she understands it may not be tomorrow, but will schedule the Colpo before she leaves the office. [facility] already aware of the 06/02/2021 abnormal PAP and the need for a Colpo per earlier phone call today. Hart Carwin, RN

## 2021-06-24 ENCOUNTER — Encounter: Payer: Medicaid Other | Admitting: Obstetrics and Gynecology

## 2021-06-27 ENCOUNTER — Encounter: Payer: Self-pay | Admitting: Obstetrics and Gynecology

## 2021-06-30 NOTE — Addendum Note (Signed)
Addended by: Wendi Snipes on: 06/30/2021 03:38 PM   Modules accepted: Orders

## 2021-07-07 ENCOUNTER — Telehealth: Payer: Self-pay

## 2021-07-07 NOTE — Telephone Encounter (Signed)
Telephone call to patient today to inquire about her 06/24/2021 no show for Colpo referral at [facility] for + HPV and her 07/27/2021 rescheduled Colpo referral that has not been cancelled.  Patient contact number has a voicemail that has not yet been setup. Missed Colpo letter mailed today via [employer] [phone].  Hart Carwin, RN

## 2021-07-12 ENCOUNTER — Encounter: Payer: Self-pay | Admitting: Internal Medicine

## 2021-07-12 ENCOUNTER — Other Ambulatory Visit: Payer: Self-pay

## 2021-07-12 ENCOUNTER — Ambulatory Visit: Payer: Medicaid Other | Admitting: Internal Medicine

## 2021-07-12 VITALS — BP 143/74 | HR 89 | Ht 64.0 in | Wt 108.1 lb

## 2021-07-12 DIAGNOSIS — Z0000 Encounter for general adult medical examination without abnormal findings: Secondary | ICD-10-CM | POA: Diagnosis not present

## 2021-07-12 DIAGNOSIS — Z23 Encounter for immunization: Secondary | ICD-10-CM

## 2021-07-12 NOTE — Assessment & Plan Note (Signed)
Flu shot given

## 2021-07-12 NOTE — Assessment & Plan Note (Addendum)
Patient was advised to have a flu shot, patient will be seen by the gynecologist for her Pap smear.

## 2021-07-12 NOTE — Progress Notes (Signed)
New Patient Office Visit  Subjective:  Patient ID: Wanda Dunlap, female    DOB: 29-Sep-1985  Age: 35 y.o. MRN: [id number]  CC:  Chief Complaint  Patient presents with   New Patient (Initial Visit)    HPI Patient presents for general check up  Past Medical History:  Diagnosis Date   Anxiety    Depression    H/O: substance abuse (HCC)    Vaginal Pap smear, abnormal      Current Outpatient Medications:    SUBOXONE 8-2 MG FILM, Place under the tongue 2 (two) times daily., Disp: , Rfl:    Past Surgical History:  Procedure Laterality Date   LEEP N/A 03/30/2015   Procedure: LOOP ELECTROSURGICAL EXCISION PROCEDURE (LEEP);  Surgeon: Nadara Mustard, MD;  Location: [facility] ORS;  Service: Gynecology;  Laterality: N/A;    Family History  Problem Relation Age of Onset   Diabetes Paternal Grandfather    Stroke Paternal Grandfather    Dementia Paternal Grandmother    Diabetes Father    Iron deficiency Brother    Audiological scientist     Social History   Socioeconomic History   Marital status: Single    Spouse name: Not on file   Number of children: 1   Years of education: 12   Highest education level: High school graduate  Occupational History   Not on file  Tobacco Use   Smoking status: Former    Packs/day: 0.25    Types: Cigarettes    Quit date: 03/04/2020    Years since quitting: 1.3    Passive exposure: Current (pt's son's father is smoker - smokes outside the home)   Smokeless tobacco: Never  Vaping Use   Vaping Use: Never used  Substance and Sexual Activity   Alcohol use: No    Alcohol/week: 0.0 standard drinks   Drug use: Yes    Types: Other-see comments    Comment: 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  Presently uses suboxone 8-2   Sexual activity: Not Currently    Partners: Male    Birth control/protection: Implant    Comment: Neplanon placed 07/15/18  Other Topics Concern   Not on file  Social History Narrative   Not on file   Social Determinants of Health    Financial Resource Strain: Not on file  Food Insecurity: Not on file  Transportation Needs: Not on file  Physical Activity: Not on file  Stress: Not on file  Social Connections: Not on file  Intimate Partner Violence: Not At Risk   Fear of Current or Ex-Partner: No   Emotionally Abused: No   Physically Abused: No   Sexually Abused: No    ROS Review of Systems  Constitutional: Negative.   HENT: Negative.    Eyes: Negative.   Respiratory: Negative.    Cardiovascular: Negative.   Gastrointestinal: Negative.   Endocrine: Negative.   Genitourinary: Negative.   Musculoskeletal: Negative.   Skin: Negative.   Allergic/Immunologic: Negative.   Neurological: Negative.   Hematological: Negative.   Psychiatric/Behavioral: Negative.    All other systems reviewed and are negative.  Objective:   Today's Vitals: BP (!) 143/74   Pulse 89   Ht 5\' 4"  (1.626 m)   Wt 108 lb 1.6 oz (49 kg)   BMI 18.56 kg/m   Physical Exam Vitals reviewed.  Constitutional:      Appearance: Normal appearance. She is not ill-appearing, toxic-appearing or diaphoretic.  HENT:     Head: Normocephalic.  Right Ear: Ear canal normal.     Left Ear: Ear canal normal.     Nose: Nose normal.     Mouth/Throat:     Mouth: Mucous membranes are moist.  Eyes:     Pupils: Pupils are equal, round, and reactive to light.  Cardiovascular:     Rate and Rhythm: Regular rhythm. Tachycardia present.     Heart sounds: No murmur heard.   No friction rub. No gallop.  Pulmonary:     Effort: No respiratory distress.     Breath sounds: No wheezing or rales.  Abdominal:     General: Abdomen is flat. There is no distension.     Tenderness: There is no guarding.  Musculoskeletal:        General: Normal range of motion.  Skin:    General: Skin is warm.  Neurological:     General: No focal deficit present.     Mental Status: She is alert.     Motor: No weakness.  Psychiatric:        Mood and Affect: Mood normal.     Assessment & Plan:   Problem List Items Addressed This Visit       Other   Need for influenza vaccination - Primary    Flu shot given      Relevant Orders   Flu Vaccine QUAD 21mo+IM (Fluarix, Fluzone & Alfiuria Quad PF) (Completed)   Normal exam    Patient was advised to have a flu shot, patient will be seen by the gynecologist for her Pap smear.       Outpatient Encounter Medications as of 07/12/2021  Medication Sig   SUBOXONE 8-2 MG FILM Place under the tongue 2 (two) times daily.   No facility-administered encounter medications on file as of 07/12/2021.    Follow-up: No follow-ups on file.   Corky Downs, MD

## 2021-07-27 ENCOUNTER — Encounter: Payer: Self-pay | Admitting: Obstetrics and Gynecology

## 2021-08-30 ENCOUNTER — Encounter: Payer: Medicaid Other | Admitting: Obstetrics and Gynecology

## 2021-09-06 ENCOUNTER — Encounter: Payer: Self-pay | Admitting: Obstetrics and Gynecology

## 2021-09-06 ENCOUNTER — Other Ambulatory Visit (HOSPITAL_COMMUNITY)
Admission: RE | Admit: 2021-09-06 | Discharge: 2021-09-06 | Disposition: A | Discharge status: Home / Self Care | Payer: Medicaid Other | Source: Ambulatory Visit | Attending: Obstetrics and Gynecology | Admitting: Obstetrics and Gynecology

## 2021-09-06 ENCOUNTER — Ambulatory Visit (INDEPENDENT_AMBULATORY_CARE_PROVIDER_SITE_OTHER): Payer: Medicaid Other | Admitting: Obstetrics and Gynecology

## 2021-09-06 ENCOUNTER — Other Ambulatory Visit: Payer: Self-pay

## 2021-09-06 VITALS — BP 123/73 | HR 87 | Ht 64.0 in | Wt 109.0 lb

## 2021-09-06 DIAGNOSIS — R87618 Other abnormal cytological findings on specimens from cervix uteri: Secondary | ICD-10-CM | POA: Insufficient documentation

## 2021-09-06 DIAGNOSIS — B977 Papillomavirus as the cause of diseases classified elsewhere: Secondary | ICD-10-CM

## 2021-09-06 DIAGNOSIS — N72 Inflammatory disease of cervix uteri: Secondary | ICD-10-CM | POA: Diagnosis not present

## 2021-09-06 NOTE — Progress Notes (Signed)
HPI:      [name] is a 36 y.o. G2P1011 who LMP was No LMP recorded. Patient has had an implant.  Subjective:   She presents today as referral from [place] because of positive HPV.  Of note her cytology was negative.  Patient underwent LEEP in 2016.  She reports that several Pap smears after that were normal.  She says this is her first abnormal since the LEEP. Patient has discontinued tobacco use 1 year ago!!!!!!!    Hx: The following portions of the patient's history were reviewed and updated as appropriate:             She  has a past medical history of Anxiety, Depression, H/O: substance abuse ([place]), and Vaginal Pap smear, abnormal. She does not have any pertinent problems on file. She  has a past surgical history that includes LEEP (N/A, 03/30/2015). Her family history includes Dementia in her paternal grandmother; Diabetes in her father and paternal grandfather; Healthy in her brother; Iron deficiency in her brother; Stroke in her paternal grandfather. She  reports that she quit smoking about 18 months ago. Her smoking use included cigarettes. She smoked an average of .25 packs per day. She has been exposed to tobacco smoke. She has never used smokeless tobacco. She reports current drug use. Drug: Other-see comments. She reports that she does not drink alcohol. She has a current medication list which includes the following prescription(s): suboxone. She has No Known Allergies.       Review of Systems:  Review of Systems  Constitutional: Denied constitutional symptoms, night sweats, recent illness, fatigue, fever, insomnia and weight loss.  Eyes: Denied eye symptoms, eye pain, photophobia, vision change and visual disturbance.  Ears/Nose/Throat/Neck: Denied ear, nose, throat or neck symptoms, hearing loss, nasal discharge, sinus congestion and sore throat.  Cardiovascular: Denied cardiovascular symptoms, arrhythmia, chest pain/pressure, edema, exercise  intolerance, orthopnea and palpitations.  Respiratory: Denied pulmonary symptoms, asthma, pleuritic pain, productive sputum, cough, dyspnea and wheezing.  Gastrointestinal: Denied, gastro-esophageal reflux, melena, nausea and vomiting.  Genitourinary: Denied genitourinary symptoms including symptomatic vaginal discharge, pelvic relaxation issues, and urinary complaints.  Musculoskeletal: Denied musculoskeletal symptoms, stiffness, swelling, muscle weakness and myalgia.  Dermatologic: Denied dermatology symptoms, rash and scar.  Neurologic: Denied neurology symptoms, dizziness, headache, neck pain and syncope.  Psychiatric: Denied psychiatric symptoms, anxiety and depression.  Endocrine: Denied endocrine symptoms including hot flashes and night sweats.   Meds:   Current Outpatient Medications on File Prior to Visit  Medication Sig Dispense Refill   SUBOXONE 8-2 MG FILM Place under the tongue 2 (two) times daily.     No current facility-administered medications on file prior to visit.      Objective:     Vitals:   09/06/21 1118  BP: 123/73  Pulse: 87   Filed Weights   09/06/21 1118  Weight: 109 lb (49.4 kg)              Physical examination   Pelvic:   Vulva: Normal appearance.  No lesions.  Vagina: No lesions or abnormalities noted.  Support: Normal pelvic support.  Urethra No masses tenderness or scarring.  Meatus Normal size without lesions or prolapse.  Cervix: Obviously status post procedure  Anus: Normal exam.  No lesions.  Perineum: Normal exam.  No lesions.        Bimanual   Uterus: Normal size.  Non-tender.  Mobile.  AV.  Adnexae: No masses.  Non-tender to palpation.  Cul-de-sac: Negative  for abnormality.             Assessment:    G2P1011 Patient Active Problem List   Diagnosis Date Noted   Need for influenza vaccination 07/12/2021   Normal exam 07/12/2021   History of abnormal cervical Pap smear 06/02/2021   Pregnancy 01/23/2015   Postpartum care  following vaginal delivery 01/23/2015     1. Other abnormal cytological finding of specimen from cervix   2. High risk human papilloma virus (HPV) infection of cervix     HPV positive but cytology negative  Remote history of LEEP   Plan:            1.  Per ASCCP guidelines:   Patient may have repeat cotesting in 1 year for HPV DNA typing.  We have discussed these recommendations in detail.  I have also offered her a colposcopy today.  She has decided to have HPV DNA typing performed.  If 16, 18 or 45 recommend colposcopy. Otherwise patient will undergo cotesting in 1 year.  Orders No orders of the defined types were placed in this encounter.   No orders of the defined types were placed in this encounter.     F/U  No follow-ups on file. I spent 31 minutes involved in the care of this patient preparing to see the patient by obtaining and reviewing her medical history (including labs, imaging tests and prior procedures), documenting clinical information in the electronic health record (EHR), counseling and coordinating care plans, writing and sending prescriptions, ordering tests or procedures and in direct communicating with the patient and medical staff discussing pertinent items from her history and physical exam.  Finis Bud, M.D. 09/06/2021 11:50 AM

## 2021-09-14 LAB — CYTOLOGY - PAP
Adequacy: ABSENT
Comment: NEGATIVE
Comment: NEGATIVE
Diagnosis: NEGATIVE
HPV 16: NEGATIVE
HPV 18 / 45: NEGATIVE
High risk HPV: POSITIVE — AB

## 2021-09-21 NOTE — Progress Notes (Signed)
Wanda Dunlap: Your Pap smear shows only HPV but no cellular abnormalities at this time.  In addition the HPV that is present is not 16 or 18.  This is a good result.  As we discussed in the office I think it would be appropriate management would be to do another Pap smear in 1 year.

## 2021-10-10 ENCOUNTER — Other Ambulatory Visit: Payer: Self-pay

## 2021-10-10 ENCOUNTER — Encounter: Payer: Self-pay | Admitting: Internal Medicine

## 2021-10-10 ENCOUNTER — Ambulatory Visit (INDEPENDENT_AMBULATORY_CARE_PROVIDER_SITE_OTHER): Payer: Medicaid Other | Admitting: Internal Medicine

## 2021-10-10 VITALS — BP 114/70 | HR 89 | Ht 64.0 in | Wt 110.4 lb

## 2021-10-10 DIAGNOSIS — Z8742 Personal history of other diseases of the female genital tract: Secondary | ICD-10-CM

## 2021-10-10 DIAGNOSIS — Z0000 Encounter for general adult medical examination without abnormal findings: Secondary | ICD-10-CM

## 2021-10-10 DIAGNOSIS — Z7189 Other specified counseling: Secondary | ICD-10-CM | POA: Diagnosis not present

## 2021-10-10 NOTE — Progress Notes (Signed)
Established Patient Office Visit  Subjective:  Patient ID: Wanda Dunlap, female    DOB: April 02, 1986  Age: 36 y.o. MRN: [id number]  CC:  Chief Complaint  Patient presents with   Follow-up    Patient is here for a follow up. Patient does not have any complaints today.    HPI  Wanda Dunlap presents for check up  Past Medical History:  Diagnosis Date   Anxiety    Depression    H/O: substance abuse (HCC)    Vaginal Pap smear, abnormal     Past Surgical History:  Procedure Laterality Date   LEEP N/A 03/30/2015   Procedure: LOOP ELECTROSURGICAL EXCISION PROCEDURE (LEEP);  Surgeon: Nadara Mustard, MD;  Location: [facility] ORS;  Service: Gynecology;  Laterality: N/A;    Family History  Problem Relation Age of Onset   Diabetes Paternal Grandfather    Stroke Paternal Grandfather    Dementia Paternal Grandmother    Diabetes Father    Iron deficiency Brother    Audiological scientist     Social History   Socioeconomic History   Marital status: Single    Spouse name: Not on file   Number of children: 1   Years of education: 12   Highest education level: High school graduate  Occupational History   Not on file  Tobacco Use   Smoking status: Former    Packs/day: 0.25    Types: Cigarettes    Quit date: 03/04/2020    Years since quitting: 1.6    Passive exposure: Current (pt's son's father is smoker - smokes outside the home)   Smokeless tobacco: Never  Vaping Use   Vaping Use: Never used  Substance and Sexual Activity   Alcohol use: No    Alcohol/week: 0.0 standard drinks   Drug use: Yes    Types: Other-see comments    Comment: 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  Presently uses suboxone 8-2   Sexual activity: Not Currently    Partners: Male    Birth control/protection: Implant    Comment: Neplanon placed 07/15/18  Other Topics Concern   Not on file  Social History Narrative   Not on file   Social Determinants of Health   Financial Resource Strain: Not on file  Food  Insecurity: Not on file  Transportation Needs: Not on file  Physical Activity: Not on file  Stress: Not on file  Social Connections: Not on file  Intimate Partner Violence: Not At Risk   Fear of Current or Ex-Partner: No   Emotionally Abused: No   Physically Abused: No   Sexually Abused: No     Current Outpatient Medications:    SUBOXONE 8-2 MG FILM, Place under the tongue 2 (two) times daily., Disp: , Rfl:    No Known Allergies  ROS Review of Systems  Constitutional: Negative.   HENT: Negative.    Eyes: Negative.   Respiratory: Negative.    Cardiovascular: Negative.   Gastrointestinal: Negative.   Endocrine: Negative.   Genitourinary: Negative.   Musculoskeletal: Negative.   Skin: Negative.   Allergic/Immunologic: Negative.   Neurological: Negative.   Hematological: Negative.   Psychiatric/Behavioral: Negative.    All other systems reviewed and are negative.    Objective:    Physical Exam Vitals reviewed.  Constitutional:      Appearance: Normal appearance.  HENT:     Mouth/Throat:     Mouth: Mucous membranes are moist.  Eyes:     Pupils: Pupils are equal, round,  and reactive to light.  Neck:     Vascular: No carotid bruit.  Cardiovascular:     Rate and Rhythm: Normal rate and regular rhythm.     Pulses: Normal pulses.     Heart sounds: Normal heart sounds.  Pulmonary:     Effort: Pulmonary effort is normal.     Breath sounds: Normal breath sounds.  Abdominal:     General: Bowel sounds are normal.     Palpations: Abdomen is soft. There is no hepatomegaly, splenomegaly or mass.     Tenderness: There is no abdominal tenderness.     Hernia: No hernia is present.  Musculoskeletal:        General: No tenderness.     Cervical back: Neck supple.     Right lower leg: No edema.     Left lower leg: No edema.  Skin:    Findings: No rash.  Neurological:     Mental Status: She is alert and oriented to person, place, and time.     Motor: No weakness.   Psychiatric:        Mood and Affect: Mood and affect normal.        Behavior: Behavior normal.    BP 114/70    Pulse 89    Ht $R'5\' 4"'tU$  (1.626 m)    Wt 110 lb 6.4 oz (50.1 kg)    BMI 18.95 kg/m  Wt Readings from Last 3 Encounters:  10/10/21 110 lb 6.4 oz (50.1 kg)  09/06/21 109 lb (49.4 kg)  07/12/21 108 lb 1.6 oz (49 kg)     Health Maintenance Due  Topic Date Due   Hepatitis C Screening  Never done   COVID-19 Vaccine (3 - Booster for Pfizer series) 04/09/2020    There are no preventive care reminders to display for this patient.  Lab Results  Component Value Date   TSH 0.17 07/07/2014   Lab Results  Component Value Date   WBC 14.7 (H) 01/24/2015   HGB 11.7 (L) 01/24/2015   HCT 35.4 01/24/2015   MCV 90.6 01/24/2015   PLT 221 01/24/2015   No results found for: NA, K, CHLORIDE, CO2, GLUCOSE, BUN, CREATININE, BILITOT, ALKPHOS, AST, ALT, PROT, ALBUMIN, CALCIUM, ANIONGAP, EGFR, GFR No results found for: CHOL No results found for: HDL No results found for: LDLCALC No results found for: TRIG No results found for: CHOLHDL No results found for: HGBA1C    Assessment & Plan:   Problem List Items Addressed This Visit       Other   History of abnormal cervical Pap smear - Primary   Normal exam   Encounter for pain management counseling    Patient is on Suboxone, he is being managed for ongoing [place] pain [facility]       No orders of the defined types were placed in this encounter.   Follow-up: No follow-ups on file.    Cletis Athens, MD

## 2021-10-10 NOTE — Assessment & Plan Note (Signed)
Patient is on Suboxone, he is being managed for ongoing [facility]

## 2022-01-02 ENCOUNTER — Ambulatory Visit: Payer: Medicaid Other | Admitting: Internal Medicine

## 2022-04-13 NOTE — Progress Notes (Signed)
PAP letter mailed today regarding the need for a repeat PAP and PE due October 2023 (after 06-02-22).  Hart Carwin, RN
